# Patient Record
Sex: Male | Born: 1995
Health system: Southern US, Community
[De-identification: ages and names within clinical notes are randomized; demographics above are authoritative.]

## PROBLEM LIST (undated history)

## (undated) HISTORY — PX: NO PAST SURGERIES: SHX2092

---
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
## 2017-07-14 ENCOUNTER — Ambulatory Visit
Admission: EM | Admit: 2017-07-14 | Discharge: 2017-07-14 | Disposition: A | Discharge status: Home / Self Care | Payer: BLUE CROSS/BLUE SHIELD | Attending: Family Medicine | Admitting: Family Medicine

## 2017-07-14 DIAGNOSIS — J069 Acute upper respiratory infection, unspecified: Secondary | ICD-10-CM | POA: Diagnosis not present

## 2017-07-14 DIAGNOSIS — R197 Diarrhea, unspecified: Secondary | ICD-10-CM | POA: Diagnosis not present

## 2017-07-14 MED ORDER — HYDROCOD POLST-CPM POLST ER 10-8 MG/5ML PO SUER: 5.0000 mL | Freq: Two times a day (BID) | ORAL | 0 refills | Status: DC

## 2017-07-14 MED ORDER — FLUTICASONE PROPIONATE 50 MCG/ACT NA SUSP: 2.0000 | Freq: Every day | NASAL | 0 refills | Status: DC

## 2017-07-14 MED ORDER — BENZONATATE 200 MG PO CAPS: ORAL_CAPSULE | ORAL | 0 refills | Status: DC

## 2017-07-14 NOTE — Discharge Instructions (Signed)
Drink plenty of fluids. Use Tylenol or Motrin for body aches and fever. If you're not improving you should follow-up with a primary care physician.

## 2017-07-14 NOTE — ED Provider Notes (Addendum)
[facility]    CSN: [id number] Arrival date & time: 07/14/17  1600     History   Chief Complaint Chief Complaint  Patient presents with  . Diarrhea    HPI Jimmy Lambert is a 21 y.o. male.   HPI  This a 21 year old male who states that he has been experiencing diarrhea since last Friday 3-4 times a day. There has been some form to the diarrhea but there was no blood or mucus. Has not had  any significant abdominal pain. In addition, he complains of a headache cough and a runny nose with nasal drainage. Temperature is 99.4 today. He does smoke a pack cigarettes daily. He has not been eating outside of the home. Nobody else in the family has been sick. He denies any vomiting or nausea.       History reviewed. No pertinent past medical history.  There are no active problems to display for this patient.   History reviewed. No pertinent surgical history.     Home Medications    Prior to Admission medications   Medication Sig Start Date End Date Taking? Authorizing Provider  benzonatate (TESSALON) 200 MG capsule Take one cap TID PRN cough 07/14/17   Lutricia Feil, PA-C  chlorpheniramine-HYDROcodone [facility] ER) 10-8 MG/5ML SUER Take 5 mLs by mouth 2 (two) times daily. 07/14/17   Lutricia Feil, PA-C  fluticasone (FLONASE) 50 MCG/ACT nasal spray Place 2 sprays into both nostrils daily. 07/14/17   Lutricia Feil, PA-C    Family History Family History  Problem Relation Age of Onset  . Diabetes Mother   . Hypertension Mother     Social History Social History  Substance Use Topics  . Smoking status: Current Every Day Smoker  . Smokeless tobacco: Never Used  . Alcohol use Yes     Allergies   Patient has no known allergies.   Review of Systems Review of Systems  Constitutional: Positive for activity change, appetite change, fatigue and fever. Negative for chills.  HENT: Positive for congestion, postnasal drip, sinus pain,  sinus pressure and sore throat.   Respiratory: Positive for cough. Negative for shortness of breath, wheezing and stridor.   Gastrointestinal: Positive for diarrhea. Negative for abdominal distention, abdominal pain, anal bleeding, blood in stool, constipation, nausea and vomiting.  All other systems reviewed and are negative.    Physical Exam Triage Vital Signs ED Triage Vitals  Enc Vitals Group     BP 07/14/17 1740 137/61     Pulse Rate 07/14/17 1740 72     Resp 07/14/17 1740 (!) 22     Temp 07/14/17 1740 99.4 F (37.4 C)     Temp Source 07/14/17 1740 Oral     SpO2 07/14/17 1740 100 %     Weight --      Height --      Head Circumference --      Peak Flow --      Pain Score 07/14/17 1744 8     Pain Loc --      Pain Edu? --      Excl. in GC? --    No data found.   Updated Vital Signs BP 137/61 (BP Location: Left Arm)   Pulse 72   Temp 99.4 F (37.4 C) (Oral)   Resp (!) 22   SpO2 100%   Visual Acuity Right Eye Distance:   Left Eye Distance:   Bilateral Distance:    Right Eye Near:  Left Eye Near:    Bilateral Near:     Physical Exam  Constitutional: He is oriented to person, place, and time. He appears well-developed and well-nourished. No distress.  HENT:  Head: Normocephalic.  Right Ear: External ear normal.  Left Ear: External ear normal.  Nose: Nose normal.  Mouth/Throat: Oropharynx is clear and moist. No oropharyngeal exudate.  Eyes: Pupils are equal, round, and reactive to light. Right eye exhibits no discharge. Left eye exhibits no discharge.  Neck: Normal range of motion. Neck supple.  Pulmonary/Chest: Effort normal and breath sounds normal. No respiratory distress. He has no wheezes. He has no rales.  Abdominal: Soft. Bowel sounds are normal. He exhibits no distension. There is no tenderness. There is no rebound and no guarding.  Musculoskeletal: Normal range of motion.  Lymphadenopathy:    He has no cervical adenopathy.  Neurological: He is  alert and oriented to person, place, and time.  Skin: Skin is warm and dry. He is not diaphoretic.  Psychiatric: He has a normal mood and affect. His behavior is normal. Judgment and thought content normal.  Nursing note and vitals reviewed.    UC Treatments / Results  Labs (all labs ordered are listed, but only abnormal results are displayed) Labs Reviewed - No data to display  EKG  EKG Interpretation None       Radiology No results found.  Procedures Procedures (including critical care time)  Medications Ordered in UC Medications - No data to display   Initial Impression / Assessment and Plan / UC Course  I have reviewed the triage vital signs and the nursing notes.  Pertinent labs & imaging results that were available during my care of the patient were reviewed by me and considered in my medical decision making (see chart for details).     Plan: 1. Test/x-ray results and diagnosis reviewed with patient 2. rx as per orders; risks, benefits, potential side effects reviewed with patient 3. Recommend supportive treatment with maintaining fluid intake. Use Tylenol or Motrin for aches pains and fever. This is likely a viral illness and we will treat him symptomatically. I will prescribe Tussionex for nighttime use Tessalon Perles for daytime use and Flonase for drainage of his nasal cavities. If he worsens he may return to our clinic or find a primary care physician to follow-up I will give him a note for work for today and tomorrow 4. F/u prn if symptoms worsen or don't improve   Final Clinical Impressions(s) / UC Diagnoses   Final diagnoses:  Diarrhea, unspecified type  Acute upper respiratory infection    New Prescriptions Discharge Medication List as of 07/14/2017  6:34 PM    START taking these medications   Details  benzonatate (TESSALON) 200 MG capsule Take one cap TID PRN cough, Print    chlorpheniramine-HYDROcodone (TUSSIONEX PENNKINETIC ER) 10-8 MG/5ML  SUER Take 5 mLs by mouth 2 (two) times daily., Starting Mon 07/14/2017, Print    fluticasone (FLONASE) 50 MCG/ACT nasal spray Place 2 sprays into both nostrils daily., Starting Mon 07/14/2017, Print         Controlled Substance Prescriptions  Controlled Substance Registry consulted? Not Applicable   Lutricia Feil, PA-C 07/14/17 1842    Lutricia Feil, PA-C 07/14/17 1844

## 2017-07-14 NOTE — ED Triage Notes (Signed)
Pt has been having diarrhea since Friday and body aches. [place] Spaniel nobody in his family is sick, does have a low grade temp. And did take ibuprofen for a headache earlier today. Also has cough and runny nose with nasal drainage and loss of appetite.

## 2017-07-17 ENCOUNTER — Ambulatory Visit
Admission: EM | Admit: 2017-07-17 | Discharge: 2017-07-17 | Disposition: A | Discharge status: Home / Self Care | Payer: BLUE CROSS/BLUE SHIELD | Attending: Emergency Medicine | Admitting: Emergency Medicine

## 2017-07-17 DIAGNOSIS — E860 Dehydration: Secondary | ICD-10-CM

## 2017-07-17 DIAGNOSIS — R1110 Vomiting, unspecified: Secondary | ICD-10-CM

## 2017-07-17 LAB — COMPREHENSIVE METABOLIC PANEL
ALK PHOS: 54 U/L (ref 38–126)
ALT: 21 U/L (ref 17–63)
AST: 27 U/L (ref 15–41)
Albumin: 4.9 g/dL (ref 3.5–5.0)
Anion gap: 10 (ref 5–15)
BILIRUBIN TOTAL: 0.6 mg/dL (ref 0.3–1.2)
BUN: 18 mg/dL (ref 6–20)
CALCIUM: 9.6 mg/dL (ref 8.9–10.3)
CHLORIDE: 101 mmol/L (ref 101–111)
CO2: 22 mmol/L (ref 22–32)
CREATININE: 0.8 mg/dL (ref 0.61–1.24)
Glucose, Bld: 100 mg/dL — ABNORMAL HIGH (ref 65–99)
Potassium: 3.9 mmol/L (ref 3.5–5.1)
Sodium: 133 mmol/L — ABNORMAL LOW (ref 135–145)
Total Protein: 8.6 g/dL — ABNORMAL HIGH (ref 6.5–8.1)

## 2017-07-17 LAB — URINALYSIS, COMPLETE (UACMP) WITH MICROSCOPIC
GLUCOSE, UA: NEGATIVE mg/dL
HGB URINE DIPSTICK: NEGATIVE
Ketones, ur: 40 mg/dL — AB
Leukocytes, UA: NEGATIVE
NITRITE: NEGATIVE
PH: 6.5 (ref 5.0–8.0)
PROTEIN: NEGATIVE mg/dL
SPECIFIC GRAVITY, URINE: 1.02 (ref 1.005–1.030)
Squamous Epithelial / LPF: NONE SEEN

## 2017-07-17 LAB — CBC WITH DIFFERENTIAL/PLATELET
BASOS ABS: 0 10*3/uL (ref 0–0.1)
Basophils Relative: 1 %
EOS PCT: 1 %
Eosinophils Absolute: 0.1 10*3/uL (ref 0–0.7)
HEMATOCRIT: 47 % (ref 40.0–52.0)
Hemoglobin: 16.1 g/dL (ref 13.0–18.0)
LYMPHS ABS: 1.6 10*3/uL (ref 1.0–3.6)
LYMPHS PCT: 28 %
MCH: 29.2 pg (ref 26.0–34.0)
MCHC: 34.3 g/dL (ref 32.0–36.0)
MCV: 84.9 fL (ref 80.0–100.0)
MONO ABS: 0.5 10*3/uL (ref 0.2–1.0)
Monocytes Relative: 9 %
NEUTROS ABS: 3.4 10*3/uL (ref 1.4–6.5)
Neutrophils Relative %: 61 %
Platelets: 282 10*3/uL (ref 150–440)
RBC: 5.54 MIL/uL (ref 4.40–5.90)
RDW: 13.3 % (ref 11.5–14.5)
WBC: 5.6 10*3/uL (ref 3.8–10.6)

## 2017-07-17 MED ORDER — ONDANSETRON 4 MG PO TBDP: 4.0000 mg | ORAL_TABLET | Freq: Three times a day (TID) | ORAL | 0 refills | Status: DC | PRN

## 2017-07-17 MED ORDER — ONDANSETRON 4 MG PO TBDP
4.0000 mg | ORAL_TABLET | Freq: Once | ORAL | Status: AC
  Administered 2017-07-17: 4 mg via ORAL

## 2017-07-17 NOTE — ED Provider Notes (Signed)
HPI  SUBJECTIVE:  Jimmy Lambert is a 21 y.o. male who presents with nonbilious, nonbloody emesis 3 starting today. States that he is afraid to eat because he is afraid that he is going to throw up again. He has not thrown up since this morning. Denies abdominal pain. patient was seen here 3 days ago for 3 days of diarrhea. He was also complaining of a headache, cough, rhinorrhea. He was thought to have a unspecified diarrhea and a URI. Sent home with flonase, Tussionex and Tessalon. States that he took these medicines for several days without any problem. States that the diarrhea persisted so had 5-6 days of diarrhea total but it resolved last night. His last bowel movement was last night. He reports increased urine output, no change in his fluids. He reports urinary frequency. He denies fevers, abdominal pain, anorexia. No pain with car ride over here. He reports persistent coughing and nasal congestion, and postnasal drip. No wheezing, chest pain, shortness of breath. No abdominal distention. No headache, neck stiffness, rash. No back pain. No testicular or penile pain, penile discharge. No dysuria, urgency, cloudy or odorous urine, hematuria. No unintentional weight loss. He tried rest with improvement in symptoms. Also exposure to cold air helps. Symptoms are worse with being in the heat. He has a past medical history of syncope in the inguinal hernia that resolved on its own. No history of diabetes, hypertension, alcohol abuse, excessive NSAID use, abdominal surgeries, gallbladder disease, appendicitis, pancreatitis. Family history: Mom with diabetes. PMD: None.    No past medical history on file.  No past surgical history on file.  Family History  Problem Relation Age of Onset  . Diabetes Mother   . Hypertension Mother     Social History  Substance Use Topics  . Smoking status: Current Every Day Smoker  . Smokeless tobacco: Never Used  . Alcohol use Yes    No current  facility-administered medications for this encounter.   Current Outpatient Prescriptions:  .  benzonatate (TESSALON) 200 MG capsule, Take one cap TID PRN cough, Disp: 30 capsule, Rfl: 0 .  fluticasone (FLONASE) 50 MCG/ACT nasal spray, Place 2 sprays into both nostrils daily., Disp: 16 g, Rfl: 0 .  ondansetron (ZOFRAN ODT) 4 MG disintegrating tablet, Take 1 tablet (4 mg total) by mouth every 8 (eight) hours as needed for nausea or vomiting., Disp: 20 tablet, Rfl: 0  No Known Allergies   ROS  As noted in HPI.   Physical Exam  BP 132/80 (BP Location: Left Arm)   Pulse 65   Temp 99 F (37.2 C) (Oral)   Resp 12   Ht  (1.778 m)   Wt 130 lb (59 kg)   SpO2 100%   BMI 18.65 kg/m    Orthostatic VS for the past 24 hrs:  BP- Lying Pulse- Lying BP- Sitting Pulse- Sitting BP- Standing at 0 minutes Pulse- Standing at 0 minutes  07/17/17 1849 125/72 57 126/69 60 117/81 81    Constitutional: Well developed, well nourished, no acute distress Eyes:  EOMI, conjunctiva normal bilaterally HENT: Normocephalic, atraumatic,mucus membranes moist Respiratory: Normal inspiratory effort Lungs clear bilaterally, good air movement  Cardiovascular: Normal rate, Regular rhythm no murmurs rubs or gallops GI: nondistended, Flat, soft, nontender. Negative Murphy, negative McBurney. Active bowel sounds. No guarding, rebound.  Back: No CVAT skin: No rash, skin intact Refill less than 2 seconds. Musculoskeletal: no deformities Neurologic: Alert & oriented x 3, no focal neuro deficits Psychiatric: Speech and behavior  appropriate   ED Course   Medications  ondansetron (ZOFRAN-ODT) disintegrating tablet 4 mg (4 mg Oral Given 07/17/17 1848)    Orders Placed This Encounter  Procedures  . Comprehensive metabolic panel    Standing Status:   Standing    Number of Occurrences:   1  . Urinalysis, Complete w Microscopic    Standing Status:   Standing    Number of Occurrences:   1  . CBC with  Differential    Standing Status:   Standing    Number of Occurrences:   1  . Orthostatic vital signs    Standing Status:   Standing    Number of Occurrences:   1  . Offer Fluids    Standing Status:   Standing    Number of Occurrences:   20    Results for orders placed or performed during the hospital encounter of 07/17/17 (from the past 24 hour(s))  Comprehensive metabolic panel     Status: Abnormal   Collection Time: 07/17/17  6:43 PM  Result Value Ref Range   Sodium 133 (L) 135 - 145 mmol/L   Potassium 3.9 3.5 - 5.1 mmol/L   Chloride 101 101 - 111 mmol/L   CO2 22 22 - 32 mmol/L   Glucose, Bld 100 (H) 65 - 99 mg/dL   BUN 18 6 - 20 mg/dL   Creatinine, Ser 4.54 0.61 - 1.24 mg/dL   Calcium 9.6 8.9 - 09.8 mg/dL   Total Protein 8.6 (H) 6.5 - 8.1 g/dL   Albumin 4.9 3.5 - 5.0 g/dL   AST 27 15 - 41 U/L   ALT 21 17 - 63 U/L   Alkaline Phosphatase 54 38 - 126 U/L   Total Bilirubin 0.6 0.3 - 1.2 mg/dL   GFR calc non Af Amer >60 >60 mL/min   GFR calc Af Amer >60 >60 mL/min   Anion gap 10 5 - 15  CBC with Differential     Status: None   Collection Time: 07/17/17  6:43 PM  Result Value Ref Range   WBC 5.6 3.8 - 10.6 K/uL   RBC 5.54 4.40 - 5.90 MIL/uL   Hemoglobin 16.1 13.0 - 18.0 g/dL   HCT 11.9 14.7 - 82.9 %   MCV 84.9 80.0 - 100.0 fL   MCH 29.2 26.0 - 34.0 pg   MCHC 34.3 32.0 - 36.0 g/dL   RDW 56.2 13.0 - 86.5 %   Platelets 282 150 - 440 K/uL   Neutrophils Relative % 61 %   Neutro Abs 3.4 1.4 - 6.5 K/uL   Lymphocytes Relative 28 %   Lymphs Abs 1.6 1.0 - 3.6 K/uL   Monocytes Relative 9 %   Monocytes Absolute 0.5 0.2 - 1.0 K/uL   Eosinophils Relative 1 %   Eosinophils Absolute 0.1 0 - 0.7 K/uL   Basophils Relative 1 %   Basophils Absolute 0.0 0 - 0.1 K/uL  Urinalysis, Complete w Microscopic     Status: Abnormal   Collection Time: 07/17/17  6:44 PM  Result Value Ref Range   Color, Urine YELLOW YELLOW   APPearance CLEAR CLEAR   Specific Gravity, Urine 1.020 1.005 - 1.030    pH 6.5 5.0 - 8.0   Glucose, UA NEGATIVE NEGATIVE mg/dL   Hgb urine dipstick NEGATIVE NEGATIVE   Bilirubin Urine SMALL (A) NEGATIVE   Ketones, ur 40 (A) NEGATIVE mg/dL   Protein, ur NEGATIVE NEGATIVE mg/dL   Nitrite NEGATIVE NEGATIVE   Leukocytes, UA NEGATIVE  NEGATIVE   Squamous Epithelial / LPF NONE SEEN NONE SEEN   WBC, UA 0-5 0 - 5 WBC/hpf   RBC / HPF 0-5 0 - 5 RBC/hpf   Bacteria, UA FEW (A) NONE SEEN   Mucus PRESENT    No results found.  ED Clinical Impression  Non-intractable vomiting, presence of nausea not specified, unspecified vomiting type  Dehydration   ED Assessment/Plan  Reviewed previous visit. As noted in history of present illness  We'll check orthostatics because of the 5 days of diarrhea and the vomiting starting today. No stool sample because the diarrhea has since resolved. We'll check a CBC, CMP and a UA because of the increased urine output/frequency and because of the diarrhea/vomiting. We'll give him some Zofran by mouth challenge. He does not appear to have a surgical abdomen at this point in time. We'll reevaluate.  Patient slightly orthostatic, but does not appear to be significantly dehydrated. Has normal cap refill. Patient has some ketonuria and small bilirubin but has no evidence of DM or a UTI. His CMP CBC, is unremarkable. He was given Zofran and was tolerating by mouth prior to discharge. Presentation is consistent with a viral illness. Doubt meningitis, sepsis, surgical abdomen, severe dehydration. Home with oral rehydration, push electrolyte containing fluids, Zofran, gave patient strict ER return precautions. Will give patient primary care referral for routine care.  Writing work note for tomorrow.  Discussed labs, MDM, plan and followup with patient. Discussed sn/sx that should prompt return to the ED. Patient  agrees with plan.   Meds ordered this encounter  Medications  . ondansetron (ZOFRAN-ODT) disintegrating tablet 4 mg  . ondansetron  (ZOFRAN ODT) 4 MG disintegrating tablet    Sig: Take 1 tablet (4 mg total) by mouth every 8 (eight) hours as needed for nausea or vomiting.    Dispense:  20 tablet    Refill:  0    *This clinic note was created using Scientist, clinical (histocompatibility and immunogenetics). Therefore, there may be occasional mistakes despite careful proofreading.  ?   Domenick Gong, MD 07/17/17 1945

## 2017-07-17 NOTE — ED Notes (Signed)
Ambulatory from department in NAD.  

## 2017-07-17 NOTE — ED Triage Notes (Signed)
Pt was seen here on Monday for body aches and low grade fever. Pt states he continues to have the same symptoms plus he started vomiting this morning x 3 times

## 2017-07-17 NOTE — Discharge Instructions (Signed)
Push electrolyte containing fluids such as Pedialyte and Gatorade. Zofran as needed for nausea and vomiting. Go immediately to the ER for fevers above 100.4, abdominal pain, if you continue to vomit despite Zofran, if you stop urinating for 12 hours, or other concerns. See primary care referral list for routine care.  Here is a list of primary care providers who are taking new patients:  Dr. Elizabeth Sauer, Dr. Schuyler Amor [address] [place] [address] [phone]  [facility] [address] [place] [address]  [phone]  [facility] [address] [place], [place] [address] [phone]  [facility] [address] [place]  [phone] [place], [place] [address]  Here are clinics/ other resources who will see you if you do not have insurance. Some have certain criteria that you must meet. Call them and find out what they are:  [facility]: [address] [place], [place] [address] Phone: [phone] Hours: First and Third Saturdays of each Month, 9 a.m. - 1 p.m.  [facility]: [address] [place], [place] [address] Phone: [phone] Hours: Tuesday, 4 p.m. - 8 p.m. Thursday, 1 p.m. - 8 p.m. Wednesday, 9 a.m. - [facility] [address] [place], [place] [address] Phone: [phone] Pharmacy Phone Number: [phone] Dental Phone Number: [phone] [facility] Insurance Help: [id number]  Dental Hours: Monday - Thursday, 8 a.m. - 6 p.m.  [facility] [address] [place], [place] [address] Phone: [phone] Pharmacy Phone Number: [phone] [facility] Insurance Help: [phone]  [facility] [address] [place] [place], [place] [address] Phone: [phone] Pharmacy Phone Number: [phone] [facility] Insurance Help: [phone]  [facility] [address] [place], [place] [address] Phone: [phone] [facility]  Insurance Help: [phone]   [facility] [address] [place], [place] [address] Phone: [phone]  Go to www.goodrx.com to look up your medications. This will give you a list of where you can find your prescriptions at the most affordable prices. Or ask the pharmacist what the cash price is, or if they have any other discount programs available to help make your medication more affordable. This can be less expensive than what you would pay with insurance.

## 2018-12-07 ENCOUNTER — Ambulatory Visit
Admission: EM | Admit: 2018-12-07 | Discharge: 2018-12-07 | Disposition: A | Discharge status: Home / Self Care | Payer: BLUE CROSS/BLUE SHIELD | Attending: Family Medicine | Admitting: Family Medicine

## 2018-12-07 ENCOUNTER — Encounter: Payer: Self-pay | Admitting: Emergency Medicine

## 2018-12-07 ENCOUNTER — Other Ambulatory Visit: Payer: Self-pay

## 2018-12-07 ENCOUNTER — Ambulatory Visit (INDEPENDENT_AMBULATORY_CARE_PROVIDER_SITE_OTHER): Payer: BLUE CROSS/BLUE SHIELD

## 2018-12-07 DIAGNOSIS — S29011A Strain of muscle and tendon of front wall of thorax, initial encounter: Secondary | ICD-10-CM

## 2018-12-07 DIAGNOSIS — X500XXA Overexertion from strenuous movement or load, initial encounter: Secondary | ICD-10-CM | POA: Diagnosis not present

## 2018-12-07 MED ORDER — MELOXICAM 15 MG PO TABS: 15.0000 mg | ORAL_TABLET | Freq: Every day | ORAL | 0 refills | Status: DC

## 2018-12-07 MED ORDER — METAXALONE 800 MG PO TABS: 800.0000 mg | ORAL_TABLET | Freq: Three times a day (TID) | ORAL | 0 refills | Status: DC

## 2018-12-07 NOTE — Discharge Instructions (Addendum)
Apply ice 20 minutes out of every 2 hours 4-5 times daily for comfort. Use  Caution while taking muscle relaxers.  Do not perform activities requiring concentration or judgment and do not drive.  °

## 2018-12-07 NOTE — ED Provider Notes (Signed)
[facility]    CSN: [id number] Arrival date & time: 12/07/18  1235     History   Chief Complaint Chief Complaint  Patient presents with  . Back Pain    HPI Jimmy Lambert is a 23 y.o. male.   HPI  Male presents with 3-day history of thoracic back pain radiation to his chest.  He works for [employer] and states that he was lifting a heavy box that he should not have afterwards started noticing the pain.  It was worse yesterday than today.  Anterior left ribs in the midclavicular line ribs 6 7 with radiation around towards his back.  States that he was having shortness of breath and was worse when he took in a deep inspiration.  Has lessened mostly today.  He does not have the chest pain but is still having pain under his left shoulder blade the inferior border.  Shortness of breath when it first happened but that has lessened considerably.       History reviewed. No pertinent past medical history.  There are no active problems to display for this patient.   Past Surgical History:  Procedure Laterality Date  . NO PAST SURGERIES         Home Medications    Prior to Admission medications   Medication Sig Start Date End Date Taking? Authorizing Provider  meloxicam (MOBIC) 15 MG tablet Take 1 tablet (15 mg total) by mouth daily. 12/07/18   Lutricia Feiloemer, William P, PA-C  metaxalone (SKELAXIN) 800 MG tablet Take 1 tablet (800 mg total) by mouth 3 (three) times daily. 12/07/18   Lutricia Feiloemer, William P, PA-C    Family History Family History  Problem Relation Age of Onset  . Diabetes Mother   . Hypertension Mother     Social History Social History   Tobacco Use  . Smoking status: Current Every Day Smoker    Packs/day: 1.00  . Smokeless tobacco: Never Used  Substance Use Topics  . Alcohol use: Not Currently  . Drug use: Not Currently     Allergies   Patient has no known allergies.   Review of Systems Review of Systems  Constitutional: Positive for activity  change. Negative for appetite change, chills, diaphoresis and fatigue.  Respiratory: Positive for chest tightness and shortness of breath.   Cardiovascular: Positive for chest pain.  Musculoskeletal: Positive for back pain.  All other systems reviewed and are negative.    Physical Exam Triage Vital Signs ED Triage Vitals  Enc Vitals Group     BP 12/07/18 1247 118/72     Pulse Rate 12/07/18 1247 73     Resp 12/07/18 1247 18     Temp 12/07/18 1247 98.2 F (36.8 C)     Temp Source 12/07/18 1247 Oral     SpO2 12/07/18 1247 100 %     Weight 12/07/18 1244 150 lb (68 kg)     Height 12/07/18 1244 5\' 9"  (1.753 m)     Head Circumference --      Peak Flow --      Pain Score 12/07/18 1244 7     Pain Loc --      Pain Edu? --      Excl. in GC? --    No data found.  Updated Vital Signs BP 118/72 (BP Location: Left Arm)   Pulse 73   Temp 98.2 F (36.8 C) (Oral)   Resp 18   Ht 5\' 9"  (1.753 m)   Wt 150  lb (68 kg)   SpO2 100%   BMI 22.15 kg/m   Visual Acuity Right Eye Distance:   Left Eye Distance:   Bilateral Distance:    Right Eye Near:   Left Eye Near:    Bilateral Near:     Physical Exam Vitals signs and nursing note reviewed.  Constitutional:      General: He is not in acute distress.    Appearance: Normal appearance. He is normal weight. He is not ill-appearing, toxic-appearing or diaphoretic.  HENT:     Head: Normocephalic.  Eyes:     General:        Right eye: No discharge.        Left eye: No discharge.     Conjunctiva/sclera: Conjunctivae normal.  Neck:     Musculoskeletal: Normal range of motion and neck supple.  Pulmonary:     Effort: Pulmonary effort is normal.     Breath sounds: Normal breath sounds.  Musculoskeletal: Normal range of motion.        General: Tenderness and signs of injury present.     Comments: Has reproducible tenderness over the sixth and seventh ribs in the clavicular line extending back to the parascapular border.  Sounds are  normal.  Thoracic rotation is comfortable bilaterally.  Skin:    General: Skin is warm and dry.  Neurological:     General: No focal deficit present.     Mental Status: He is alert and oriented to person, place, and time.  Psychiatric:        Mood and Affect: Mood normal.        Behavior: Behavior normal.        Thought Content: Thought content normal.        Judgment: Judgment normal.      UC Treatments / Results  Labs (all labs ordered are listed, but only abnormal results are displayed) Labs Reviewed - No data to display  EKG None  Radiology Dg Chest 2 View  Result Date: 12/07/2018 CLINICAL DATA:  Back injury, intermittent back pain for 3-4 years. Chest pain, anterior ribs 6 7 LEFT. EXAM: CHEST - 2 VIEW COMPARISON:  None. FINDINGS: The heart size and mediastinal contours are within normal limits. Both lungs are clear. The visualized skeletal structures are unremarkable. IMPRESSION: 1. No active cardiopulmonary disease. 2. LEFT ribs appear normal. Electronically Signed   By: Bary Richard M.D.   On: 12/07/2018 13:42    Procedures Procedures (including critical care time)  Medications Ordered in UC Medications - No data to display  Initial Impression / Assessment and Plan / UC Course  I have reviewed the triage vital signs and the nursing notes.  Pertinent labs & imaging results that were available during my care of the patient were reviewed by me and considered in my medical decision making (see chart for details).   Has sustained an intercostal muscle strain from lifting the heavy items.  Avoid symptoms much as possible.  Place ice on the area for comfort.  We will place him on both anti-inflammatory medication and Skelaxin muscle relaxer with appropriate precautions particularly with driving.  He is not improving or worsening he should follow-up with a primary care physician but may return to our clinic.   Final Clinical Impressions(s) / UC Diagnoses   Final  diagnoses:  Intercostal muscle strain, initial encounter     Discharge Instructions     Apply ice 20 minutes out of every 2 hours 4-5 times daily for comfort.  Use  Caution while taking muscle relaxers.  Do not perform activities requiring concentration or judgment and do not drive.     ED Prescriptions    Medication Sig Dispense Auth. Provider   meloxicam (MOBIC) 15 MG tablet Take 1 tablet (15 mg total) by mouth daily. 30 tablet Lutricia FeilRoemer, William P, PA-C   metaxalone (SKELAXIN) 800 MG tablet Take 1 tablet (800 mg total) by mouth 3 (three) times daily. 21 tablet Lutricia Feiloemer, William P, PA-C     Controlled Substance Prescriptions [place] Controlled Substance Registry consulted? Not Applicable   Lutricia FeilRoemer, William P, PA-C 12/07/18 2015

## 2018-12-07 NOTE — ED Triage Notes (Signed)
Pt c/o thoracic back pain, and pain in his chest. He was lifting heavy boxes at work. Started 3 days ago.  Pain has improved.

## 2019-05-29 ENCOUNTER — Encounter: Payer: Self-pay | Admitting: Emergency Medicine

## 2019-05-29 ENCOUNTER — Ambulatory Visit
Admission: EM | Admit: 2019-05-29 | Discharge: 2019-05-29 | Disposition: A | Discharge status: Home / Self Care | Payer: BC Managed Care – PPO

## 2019-05-29 ENCOUNTER — Other Ambulatory Visit: Payer: Self-pay

## 2019-05-29 DIAGNOSIS — R590 Localized enlarged lymph nodes: Secondary | ICD-10-CM

## 2019-05-29 NOTE — ED Triage Notes (Signed)
Pt has a "knot" on the right groin area. He states that the area around it is tender. He noticed it about 4 days ago. 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 He thinks he has a hernia.

## 2019-05-29 NOTE — Discharge Instructions (Addendum)
I do not feel like this is a hernia. It feels like you have a swollen lymph node. Mode of the time these are reactive to something (infection/inflammation) going on in our body. You can use warm compresses, along with Tylnenol and/or Motrin as needed for the pain.   If it does not go away, gets larger, or becomes more bothersome I would recommend further evaluation through your regular doctor.

## 2019-05-29 NOTE — ED Provider Notes (Signed)
Lambert, Jimmy   Name: Jimmy Lambert DOB: 11-12-1995 MRN: [id number] CSN: [id number] PCP: Patient, No Pcp Per  Arrival date and time:  05/29/19 1409  Chief Complaint:  Groin Swelling   NOTE: Prior to seeing the patient today, I have reviewed the triage nursing documentation and vital signs. Clinical staff has updated patient's PMH/PSHx, current medication list, and drug allergies/intolerances to ensure comprehensive history available to assist in medical decision making.   History:   HPI: Jimmy Lambert is a 23 y.o. male who presents today with complaints of what he describes as a "knot" to his RIGHT groin area that declared initially about 4 days ado. Patient notes that the areas is tender only when palpated. He denies any drainage from the area. He has not had any fevers. Patient does heavy lifting at work and is questioning a hernia. Patient states, "I bent over on Sunday and felt something pop in that area. I thought it might be a hernia coming out". He denies any other associated symptoms. He denies recent illnesses. Pain not significant enough to have warranted patient taking any over the counter pain medications per his report.   History reviewed. No pertinent past medical history.  Past Surgical History:  Procedure Laterality Date   NO PAST SURGERIES      Family History  Problem Relation Age of Onset   Diabetes Mother    Hypertension Mother     Social History   Tobacco Use   Smoking status: Current Every Day Smoker    Packs/day: 1.00   Smokeless tobacco: Never Used  Substance Use Topics   Alcohol use: Not Currently   Drug use: Not Currently    There are no active problems to display for this patient.   Home Medications:    No outpatient medications have been marked as taking for the 05/29/19 encounter [facility] Encounter).    Allergies:   Patient has no known allergies.  Review of Systems (ROS): Review of Systems  Constitutional: Negative for  chills and fever.  Respiratory: Negative for cough and shortness of breath.   Cardiovascular: Negative for chest pain and palpitations.  Gastrointestinal: Negative for abdominal pain, diarrhea, nausea and vomiting.  Genitourinary: Negative for difficulty urinating, dysuria, flank pain and hematuria.  Musculoskeletal: Negative for back pain.  Skin: Negative for color change, pallor and rash.  Neurological: Negative for dizziness, syncope, weakness and numbness.  Hematological: Negative for adenopathy.  Psychiatric/Behavioral: The patient is nervous/anxious.   All other systems reviewed and are negative.    Vital Signs: Today's Vitals   05/29/19 1443 05/29/19 1446 05/29/19 1546  BP:  116/75   Pulse:  (!) 53   Resp:  18   Temp:  98.2 F (36.8 C)   TempSrc:  Oral   SpO2:  100%   Weight: 140 lb (63.5 kg)    Height: 5\' 9"  (1.753 m)    PainSc: 0-No pain  0-No pain    Physical Exam: Physical Exam  Constitutional: He is oriented to person, place, and time and well-developed, well-nourished, and in no distress.  HENT:  Head: Normocephalic and atraumatic.  Mouth/Throat: Mucous membranes are normal.  Eyes: Pupils are equal, round, and reactive to light. EOM are normal.  Neck: Normal range of motion. Neck supple. No tracheal deviation present.  Cardiovascular: Regular rhythm, normal heart sounds and intact distal pulses. Bradycardia present. Exam reveals no gallop and no friction rub.  No murmur heard. Pulmonary/Chest: Effort normal and breath sounds normal. No respiratory  distress. He has no wheezes. He has no rales.  Abdominal: Soft. Normal appearance and bowel sounds are normal. He exhibits no distension and no mass. There is no abdominal tenderness. No hernia.  Lymphadenopathy:    He has no cervical adenopathy.       Right: Inguinal adenopathy present.  Single sub-centimeter lymph node that is TTP. No erythema.   Neurological: He is alert and oriented to person, place, and time.  Gait normal. GCS score is 15.  Skin: Skin is warm and dry. No rash noted.  Psychiatric: Memory, affect and judgment normal. His mood appears anxious.  Nursing note and vitals reviewed.   Urgent Care Treatments / Results:   LABS: PLEASE NOTE: all labs that were ordered this encounter are listed, however only abnormal results are displayed. Labs Reviewed - No data to display  EKG: -None  RADIOLOGY: No results found.  PROCEDURES: Procedures  MEDICATIONS RECEIVED THIS VISIT: Medications - No data to display  PERTINENT CLINICAL COURSE NOTES/UPDATES:   Initial Impression / Assessment and Plan / Urgent Care Course:  Pertinent labs & imaging results that were available during my care of the patient were personally reviewed by me and considered in my medical decision making (see lab/imaging section of note for values and interpretations).  Jimmy Lambert is a 23 y.o. male who [facility] today with complaints of Groin Swelling   Patient is well appearing overall in clinic today. He does not appear to be in any acute distress. Presenting symptoms (see HPI) and exam as documented above. Exam consistent with inguinal LAD. Discuss that finding is likely reactive in nature and that should be self limiting. Discussed that lymph nodes (enlarged) are commonly associated with stimulation of the immune system in response to some type of infectious process. Pictures of inguinal lymph node distributions pulled up on computer in room and reviewed with patient. Area superficial and sub-centimeter. Reassurance provided that area not felt to be a hernia. He was shown a picture of exactly where he was feeling the area of concern, which was along the inguinal lymph node chain.  Encouraged warm compresses to help promote lymphatic drainage. May use Tylenol and/or Ibuprofen as needed for tenderness. Discussed with patient are if area enlarges, becomes more painful, drains, or is concerning in  any other way he will need to be re-evaluated.   Discussed follow up with primary care physician in 1 week for re-evaluation. I have reviewed the follow up and strict return precautions for any new or worsening symptoms. Patient is aware of symptoms that would be deemed urgent/emergent, and would thus require further evaluation either here or in the emergency department. At the time of discharge, he verbalized understanding and consent with the discharge plan as it was reviewed with him. All questions were fielded by provider and/or clinic staff prior to patient discharge.    Final Clinical Impressions / Urgent Care Diagnoses:   Final diagnoses:  LAD (lymphadenopathy), inguinal    New Prescriptions:  [place] Controlled Substance Registry consulted? Not Applicable  No orders of the defined types were placed in this encounter.   Recommended Follow up Care:  Patient encouraged to follow up with the following provider within the specified time frame, or sooner as dictated by the severity of his symptoms. As always, he was instructed that for any urgent/emergent care needs, he should seek care either here or in the emergency department for more immediate evaluation.  Follow-up Information    PCP In  1 week.   Why: General reassessment of symptoms if not improving        NOTE: This note was prepared using Scientist, clinical (histocompatibility and immunogenetics)Dragon dictation software along with smaller Lobbyistphrase technology. Despite my best ability to proofread, there is the potential that transcriptional errors may still occur from this process, and are completely unintentional.     Verlee MonteGray, Naiyah Klostermann E, NP 05/30/19 2314

## 2020-06-22 ENCOUNTER — Other Ambulatory Visit: Payer: Self-pay | Admitting: Critical Care Medicine

## 2020-06-22 ENCOUNTER — Other Ambulatory Visit: Payer: Self-pay

## 2020-06-22 DIAGNOSIS — Z20822 Contact with and (suspected) exposure to covid-19: Secondary | ICD-10-CM

## 2020-06-23 LAB — SARS-COV-2, NAA 2 DAY TAT

## 2020-06-23 LAB — NOVEL CORONAVIRUS, NAA: SARS-CoV-2, NAA: DETECTED — AB

## 2020-07-04 ENCOUNTER — Other Ambulatory Visit: Payer: Self-pay

## 2020-07-04 DIAGNOSIS — Z20822 Contact with and (suspected) exposure to covid-19: Secondary | ICD-10-CM

## 2020-07-06 LAB — NOVEL CORONAVIRUS, NAA: SARS-CoV-2, NAA: NOT DETECTED

## 2020-07-06 LAB — SARS-COV-2, NAA 2 DAY TAT

## 2020-07-16 IMAGING — CR DG CHEST 2V
2 series · 2 of 2 positions shown · non-contrast
Comparison: None.

CLINICAL DATA: Back injury, intermittent back pain for 3-4 years.
Chest pain, anterior ribs 6 7 LEFT.

EXAM:
CHEST - 2 VIEW

[chest pa]
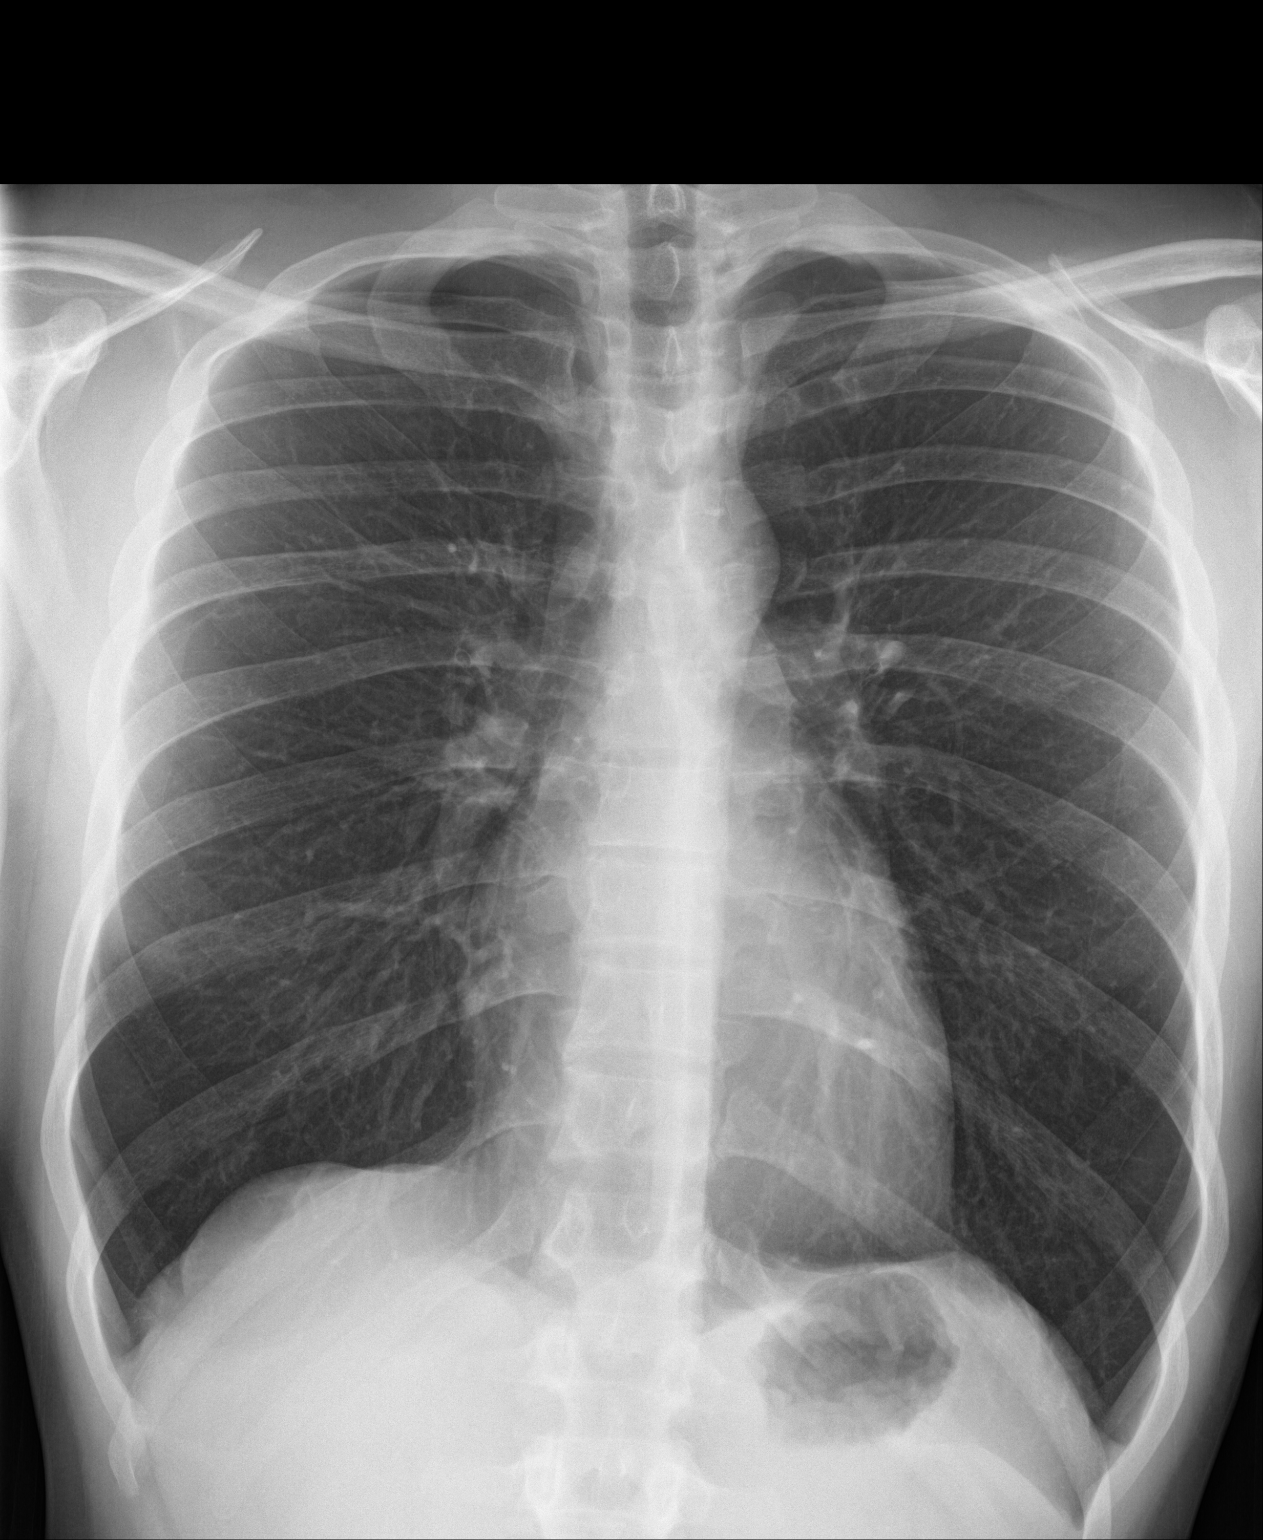

[chest lat]
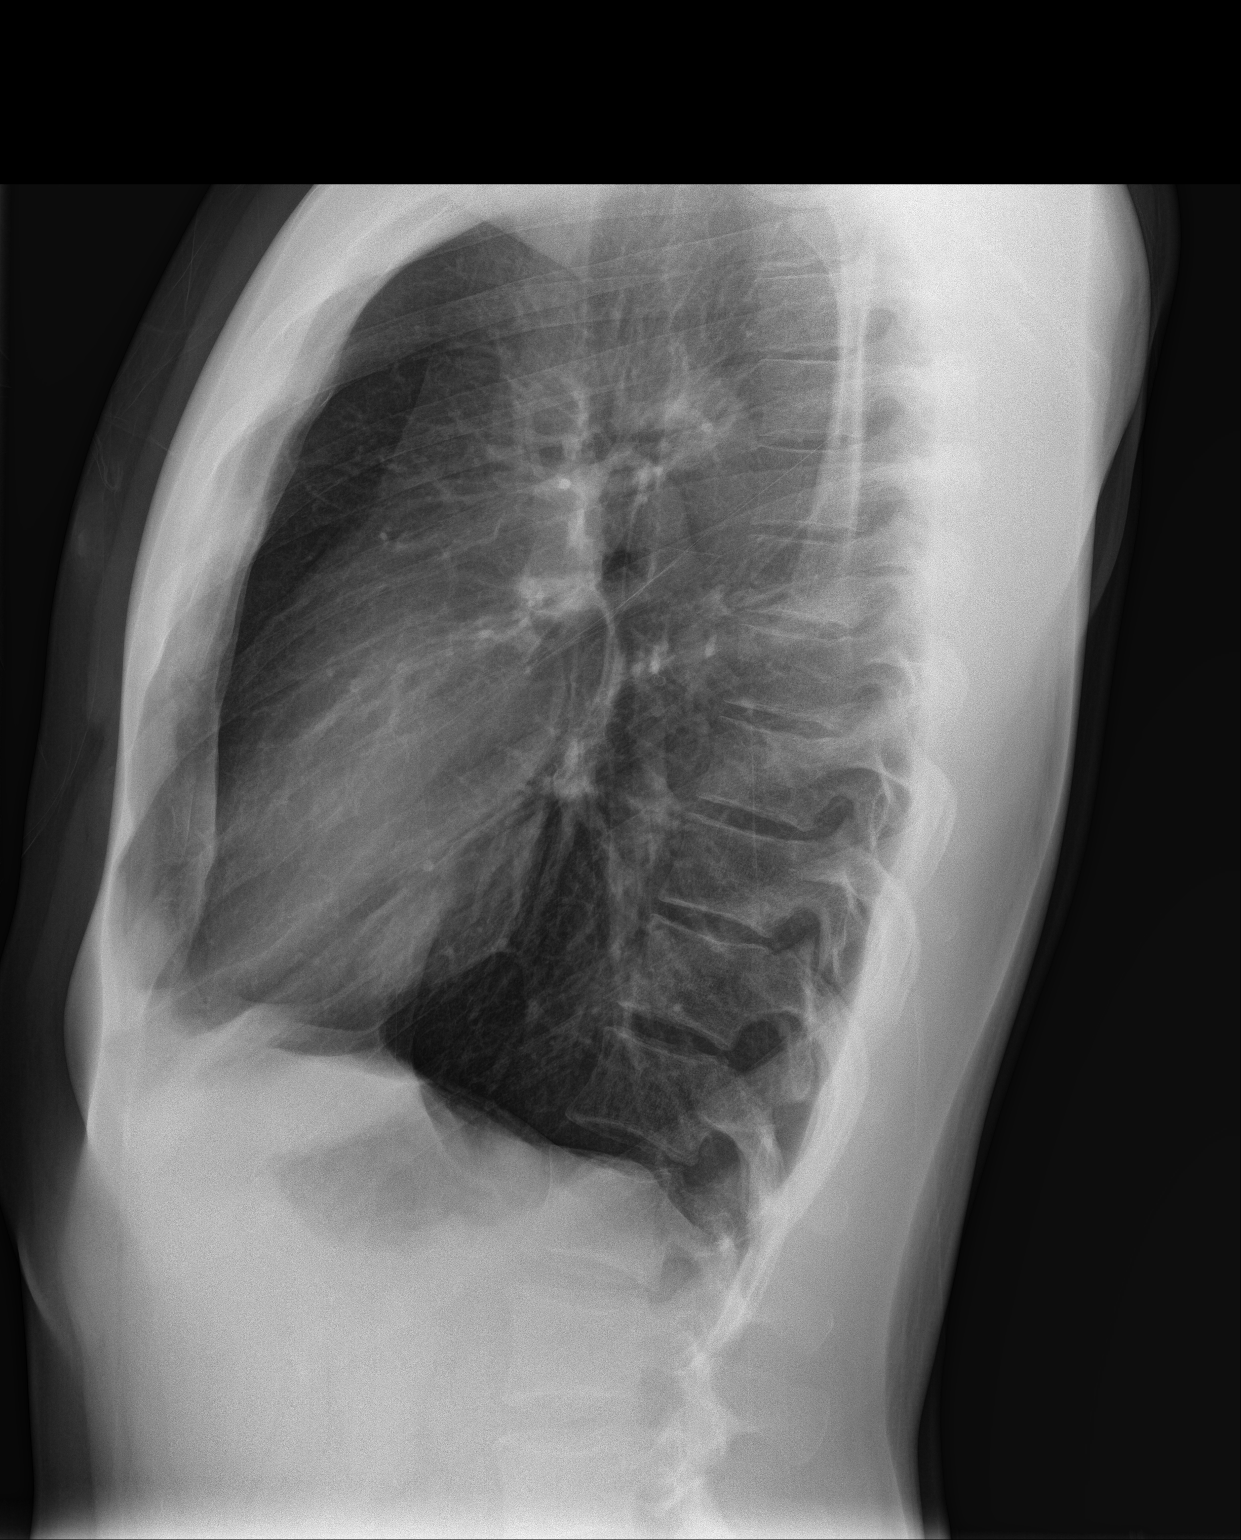

[2 of 2 positions shown; findings below may reference images not displayed]

FINDINGS: The heart size and mediastinal contours are within normal limits.
Both lungs are clear. The visualized skeletal structures are
unremarkable.
IMPRESSION: 1. No active cardiopulmonary disease.
2. LEFT ribs appear normal.

## 2020-10-31 ENCOUNTER — Other Ambulatory Visit: Payer: Self-pay

## 2020-10-31 DIAGNOSIS — Z20822 Contact with and (suspected) exposure to covid-19: Secondary | ICD-10-CM

## 2020-11-01 LAB — SARS-COV-2, NAA 2 DAY TAT

## 2020-11-01 LAB — NOVEL CORONAVIRUS, NAA: SARS-CoV-2, NAA: NOT DETECTED

## 2020-11-15 ENCOUNTER — Encounter: Payer: Self-pay | Admitting: Emergency Medicine

## 2021-07-05 ENCOUNTER — Ambulatory Visit (LOCAL_COMMUNITY_HEALTH_CENTER): Payer: Self-pay

## 2021-07-05 ENCOUNTER — Other Ambulatory Visit: Payer: Self-pay

## 2021-07-05 DIAGNOSIS — Z23 Encounter for immunization: Secondary | ICD-10-CM

## 2021-07-05 DIAGNOSIS — Z719 Counseling, unspecified: Secondary | ICD-10-CM

## 2021-07-05 NOTE — Progress Notes (Signed)
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  Per Glenna Fellows, Immunization consult: Will give State supplied Tdap and refund patient his money. Hart Carwin, RN

## 2021-10-27 ENCOUNTER — Ambulatory Visit: Payer: Self-pay

## 2021-11-05 ENCOUNTER — Other Ambulatory Visit: Payer: Self-pay

## 2021-11-05 ENCOUNTER — Ambulatory Visit: Payer: Self-pay | Admitting: Family Medicine

## 2021-11-05 ENCOUNTER — Encounter: Payer: Self-pay | Admitting: Family Medicine

## 2021-11-05 ENCOUNTER — Ambulatory Visit: Payer: Self-pay

## 2021-11-05 DIAGNOSIS — Z113 Encounter for screening for infections with a predominantly sexual mode of transmission: Secondary | ICD-10-CM

## 2021-11-05 DIAGNOSIS — Z202 Contact with and (suspected) exposure to infections with a predominantly sexual mode of transmission: Secondary | ICD-10-CM

## 2021-11-05 MED ORDER — DOXYCYCLINE HYCLATE 100 MG PO TABS: 100.0000 mg | ORAL_TABLET | Freq: Two times a day (BID) | ORAL | 0 refills | Status: AC

## 2021-11-05 NOTE — Progress Notes (Signed)
Pt here as a contact to Chlamydia.  Medication dispensed per Provider orders.  Windle Guard, RN

## 2021-11-05 NOTE — Progress Notes (Signed)
[facility] Department STI clinic/screening visit  Subjective:  Jimmy Lambert is a 26 y.o. male being seen today for an STI screening visit. The patient reports they do not have symptoms.    Patient has the following medical conditions:  There are no problems to display for this patient.    Chief Complaint  Patient presents with   SEXUALLY TRANSMITTED DISEASE    Screening and contact to Chlamydia    HPI  Patient reports here as contact to chlamydia   Does the patient or their partner desires a pregnancy in the next year? No, partner currently pregnant   Screening for MPX risk: Does the patient have an unexplained rash? No Is the patient MSM? No Does the patient endorse multiple sex partners or anonymous sex partners? No Did the patient have close or sexual contact with a person diagnosed with MPX? No Has the patient traveled outside the [place] where MPX is endemic? No Is there a high clinical suspicion for MPX-- evidenced by one of the following No  -Unlikely to be chickenpox  -Lymphadenopathy  -Rash that present in same phase of evolution on any given body part   See flowsheet for further details and programmatic requirements.    The following portions of the patient's history were reviewed and updated as appropriate: allergies, current medications, past medical history, past social history, past surgical history and problem list.  Objective:  There were no vitals filed for this visit.  Physical Exam Constitutional:      Appearance: Normal appearance.  HENT:     Head: Normocephalic.     Mouth/Throat:     Mouth: Mucous membranes are moist.     Pharynx: Oropharynx is clear. No oropharyngeal exudate.  Pulmonary:     Effort: Pulmonary effort is normal.  Genitourinary:    Comments: Deferred  Musculoskeletal:     Cervical back: Normal range of motion.  Lymphadenopathy:     Cervical: No cervical adenopathy.  Skin:    General: Skin is warm and dry.      Findings: No bruising, erythema, lesion or rash.  Neurological:     Mental Status: He is alert and oriented to person, place, and time.  Psychiatric:        Mood and Affect: Mood normal.        Behavior: Behavior normal.      Assessment and Plan:  Jimmy Lambert is a 26 y.o. male presenting to the [facility] Department for STI screening  1. Screening examination for venereal disease Patient does not have STI symptoms Patient desires treatment only d/t partner who tested + for Chlamydia is only partner since 12/2020 Patient meets criteria for HepB screening? Yes. Ordered? No - declined  Patient meets criteria for HepC screening? Yes. Ordered? No - declined  Recommended condom use with all sex Discussed importance of condom use for STI prevent  Recommended returning for continued or worsening symptoms.    2. Exposure to chlamydia Pt partner testing + for Chlamydia during prenatal screening.  - doxycycline (VIBRA-TABS) 100 MG tablet; Take 1 tablet (100 mg total) by mouth 2 (two) times daily for 7 days.  Dispense: 14 tablet; Refill: 0     Return for as needed.  No future appointments.  Wendi Snipes, FNP
